# Patient Record
Sex: Female | Born: 1965 | Race: White | Hispanic: No | Marital: Married | Smoking: Former smoker
Health system: Southern US, Community
[De-identification: ages and names within clinical notes are randomized; demographics above are authoritative.]

## PROBLEM LIST (undated history)

## (undated) HISTORY — PX: VAGINAL HYSTERECTOMY: SUR661

---

## 2008-09-29 HISTORY — PX: COLONOSCOPY: SHX174

## 2008-11-22 ENCOUNTER — Ambulatory Visit: Payer: Self-pay | Admitting: Gastroenterology

## 2008-12-18 ENCOUNTER — Encounter: Payer: Self-pay | Admitting: Gastroenterology

## 2008-12-18 ENCOUNTER — Ambulatory Visit (HOSPITAL_COMMUNITY): Admission: RE | Admit: 2008-12-18 | Discharge: 2008-12-18 | Payer: Self-pay | Admitting: Gastroenterology

## 2008-12-18 ENCOUNTER — Ambulatory Visit: Payer: Self-pay | Admitting: Gastroenterology

## 2008-12-22 ENCOUNTER — Encounter: Payer: Self-pay | Admitting: Gastroenterology

## 2008-12-26 ENCOUNTER — Encounter: Payer: Self-pay | Admitting: Gastroenterology

## 2009-01-02 ENCOUNTER — Telehealth: Payer: Self-pay | Admitting: Gastroenterology

## 2009-01-03 ENCOUNTER — Telehealth (INDEPENDENT_AMBULATORY_CARE_PROVIDER_SITE_OTHER): Payer: Self-pay

## 2009-01-10 ENCOUNTER — Encounter: Payer: Self-pay | Admitting: Gastroenterology

## 2009-02-16 ENCOUNTER — Encounter: Payer: Self-pay | Admitting: Gastroenterology

## 2009-06-16 ENCOUNTER — Emergency Department (HOSPITAL_COMMUNITY): Admission: EM | Admit: 2009-06-16 | Discharge: 2009-06-16 | Payer: Self-pay | Admitting: Emergency Medicine

## 2010-08-31 IMAGING — CR DG HAND COMPLETE 3+V*L*
3 series · 3 of 3 positions shown · non-contrast
Comparison: None

CLINICAL DATA: Left hand pain and swelling.  Thumb pain with no
known injury for 2 days.

LEFT HAND - COMPLETE 3+ VIEW

[view not recorded (1 of 3)]
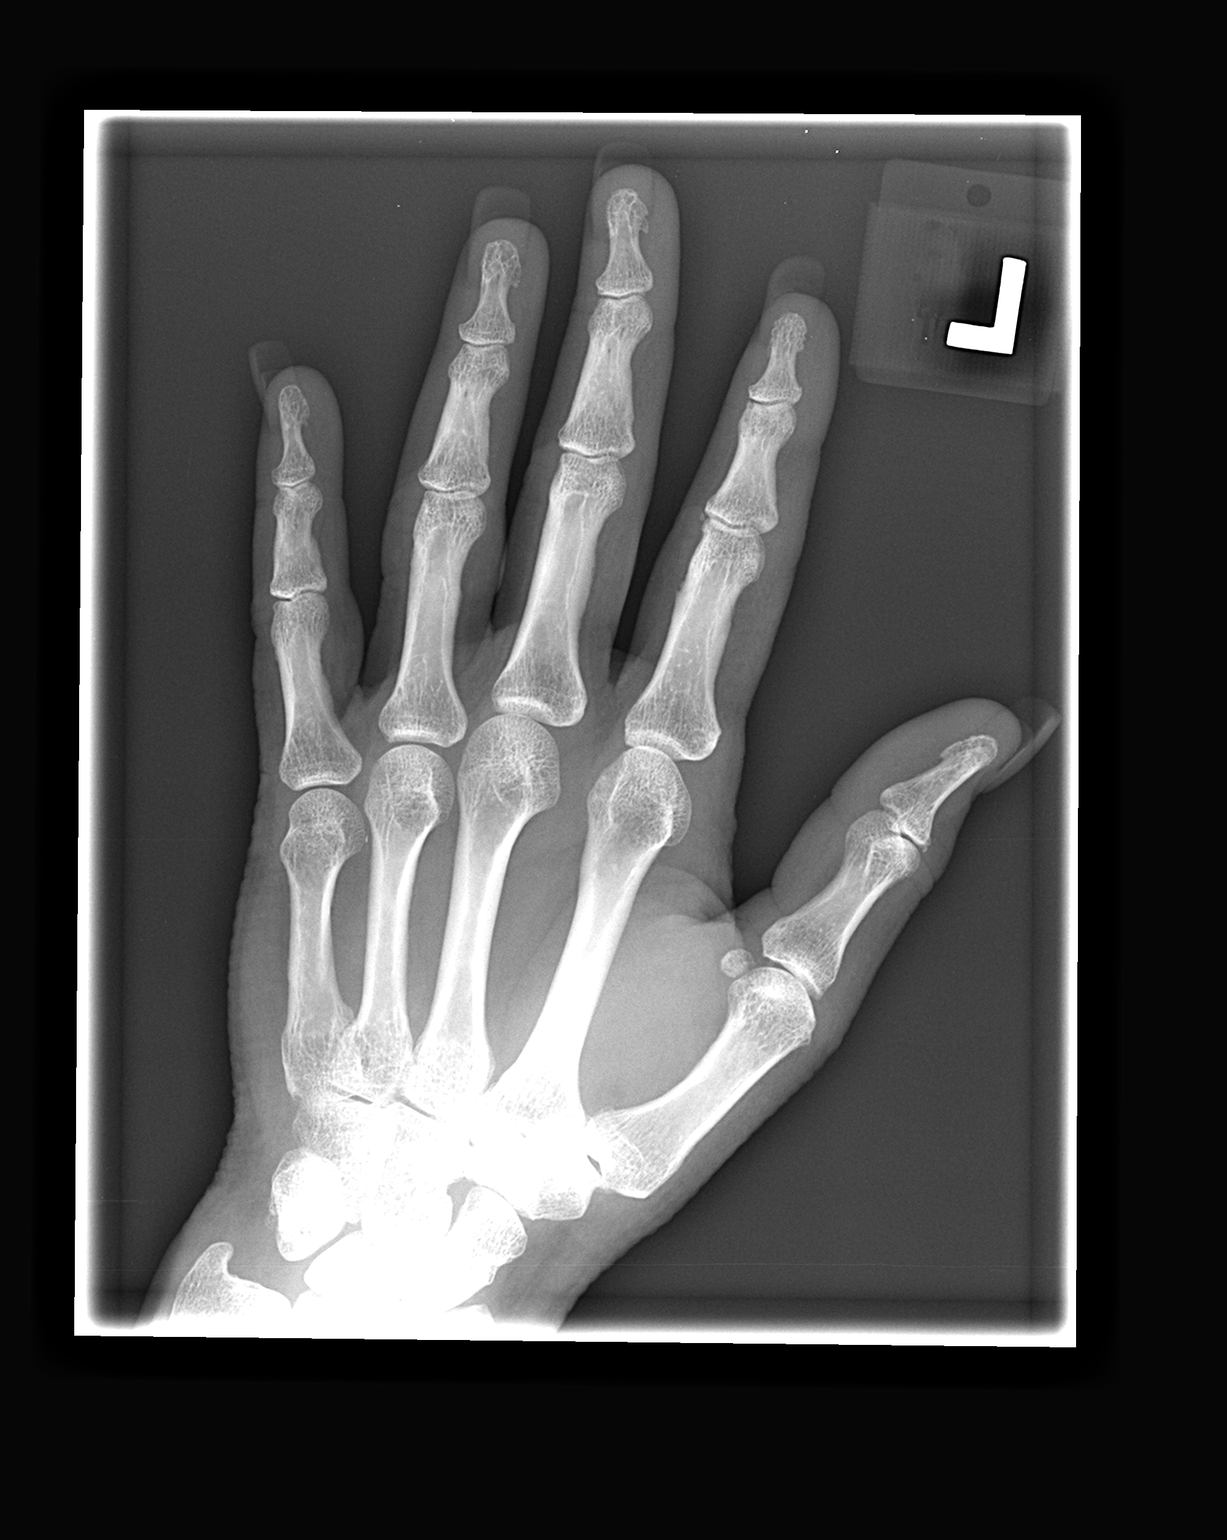

[view not recorded (2 of 3)]
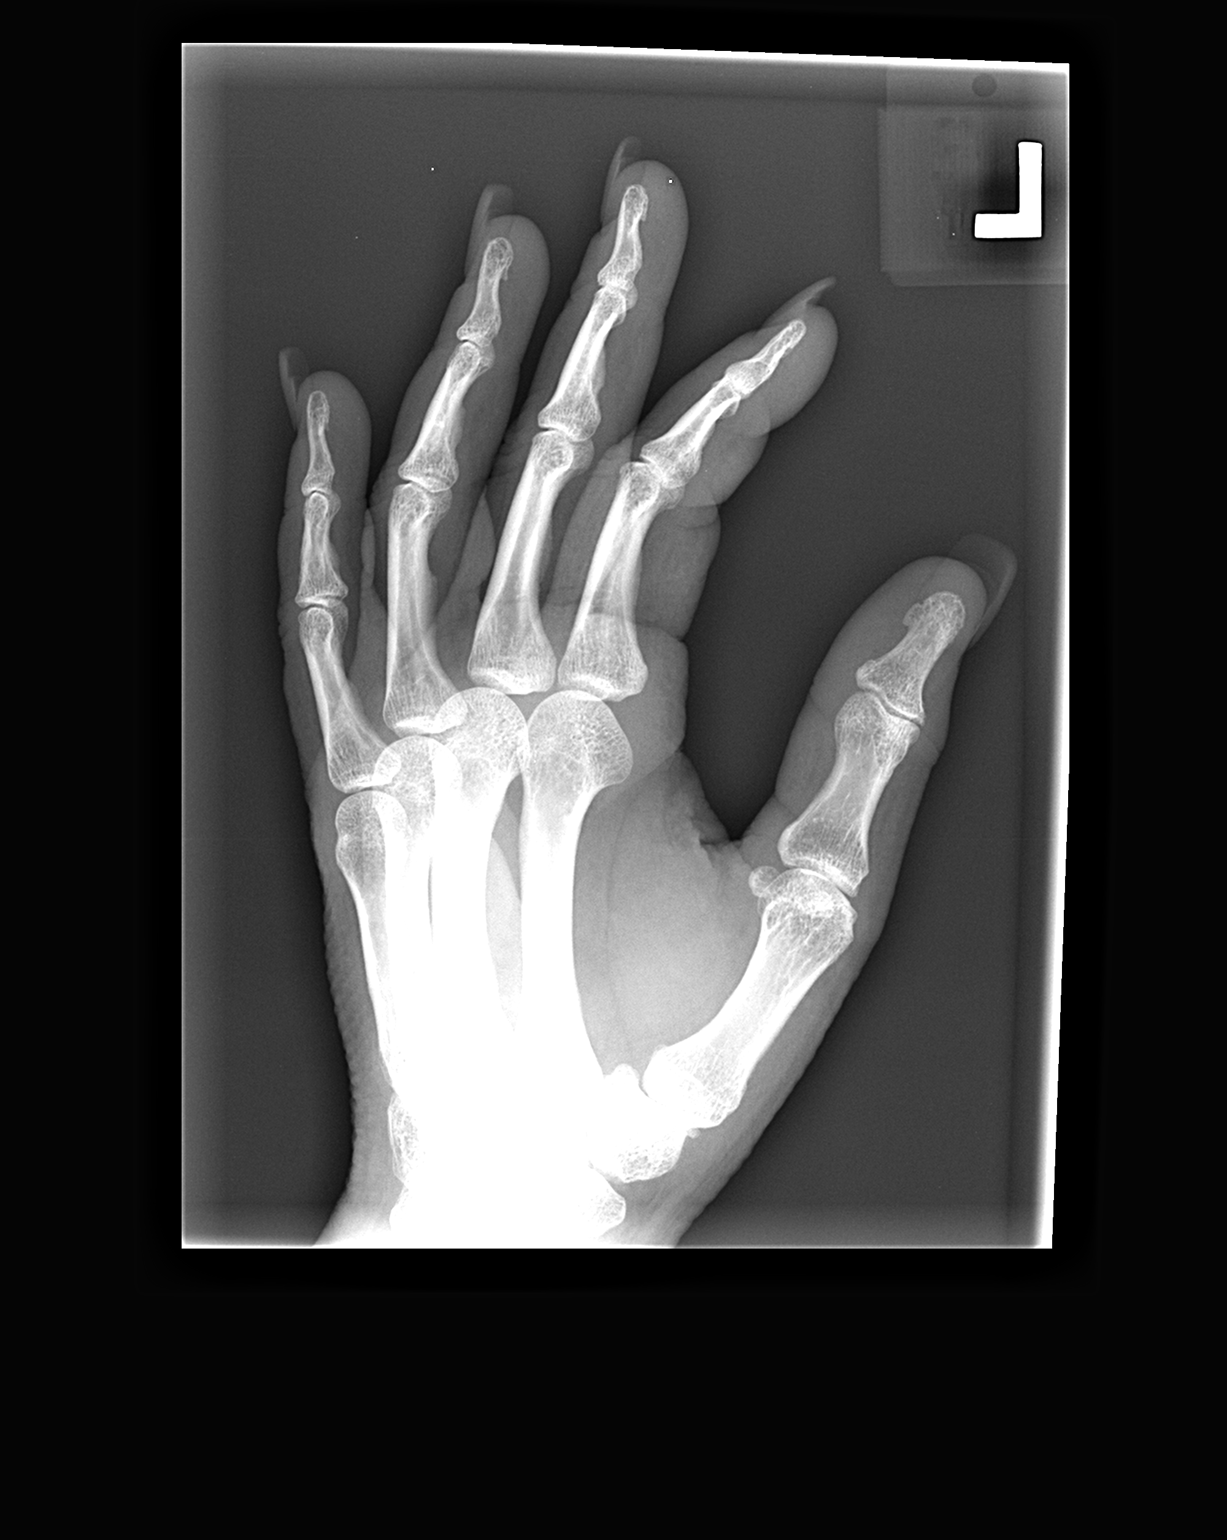

[view not recorded (3 of 3)]
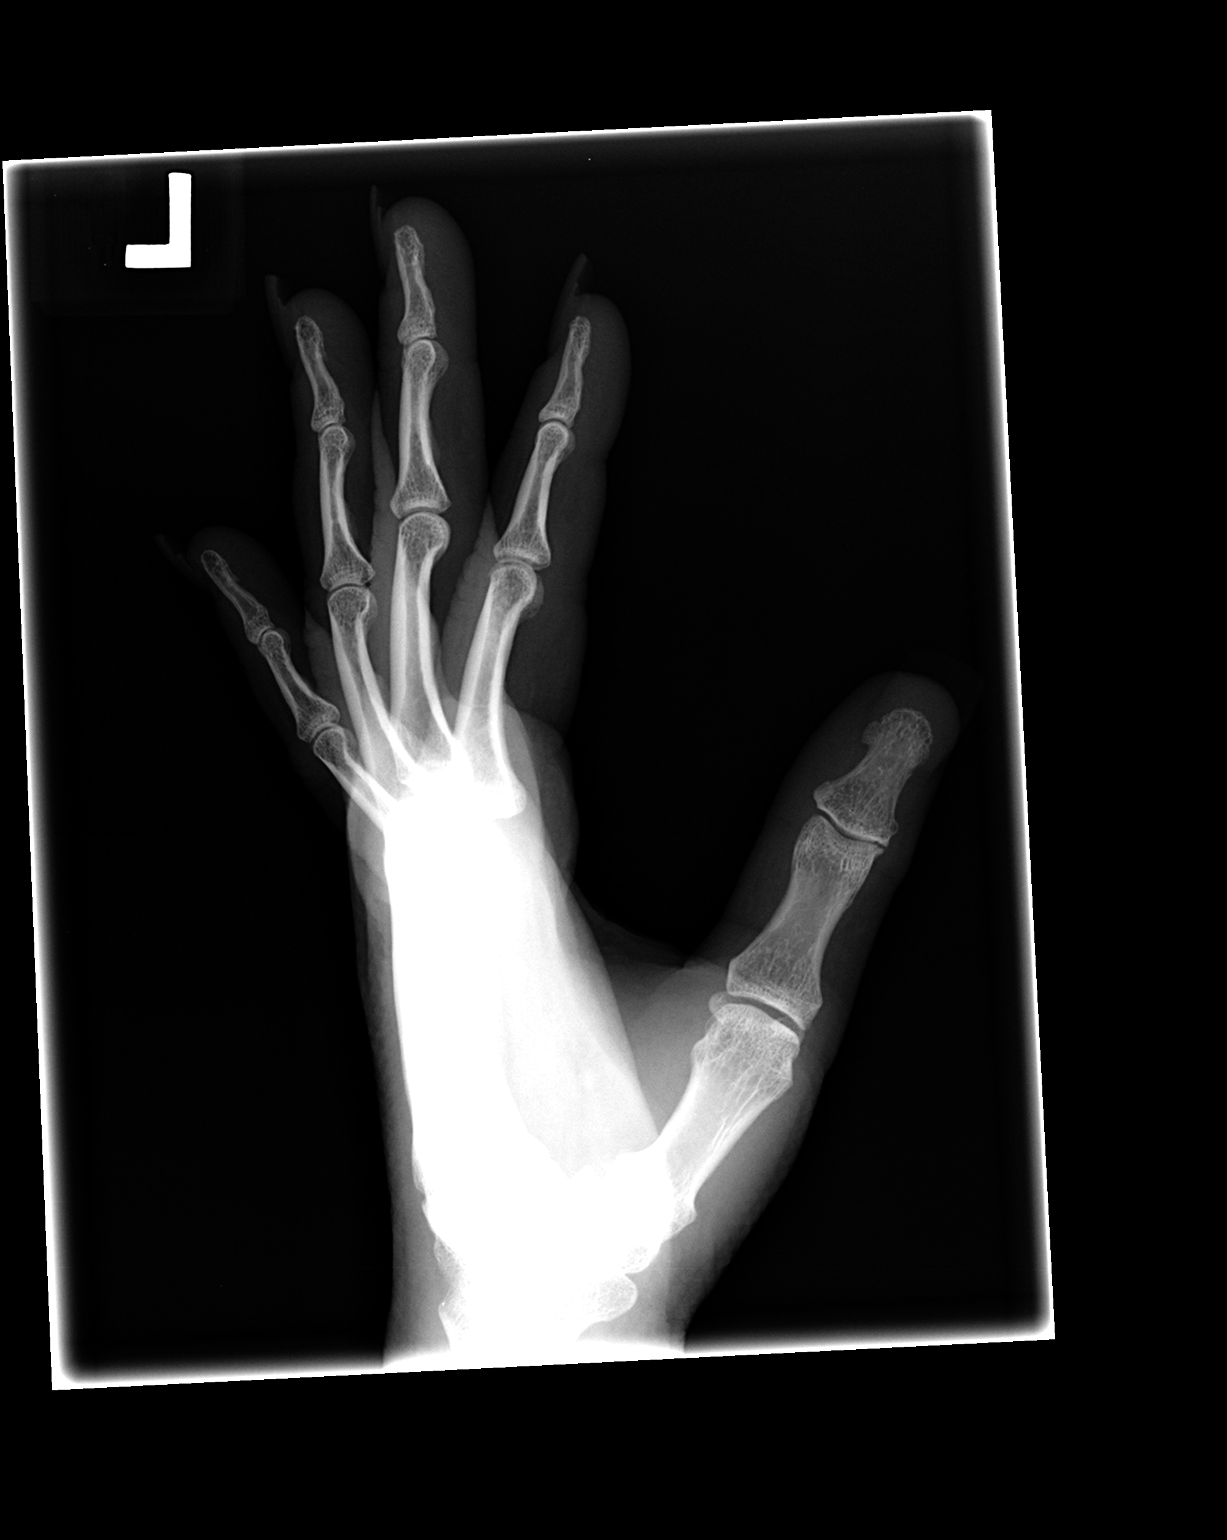

[3 of 3 positions shown; findings below may reference images not displayed]

FINDINGS: Three views are performed, showing no evidence for acute
fracture or dislocation.  No radiopaque foreign body or soft tissue
gas identified.
IMPRESSION: No evidence for acute  abnormality.

## 2011-02-11 NOTE — Op Note (Signed)
NAME:  Makayla Cantrell, Makayla Cantrell                  ACCOUNT NO.:  [id number]   MEDICAL RECORD NO.:  [id number]          PATIENT TYPE:  AMB   LOCATION:  DAY                           FACILITY:  [facility]   PHYSICIAN:  Kassie Mends, M.D.      DATE OF BIRTH:  Feb 20, 1966   DATE OF PROCEDURE:  12/18/2008  DATE OF DISCHARGE:                               OPERATIVE REPORT   REFERRING Yashira Offenberger:  Truddie Coco, Nurse Practitioner, [facility], fax number [phone].   PROCEDURE:  Colonoscopy with snare cautery polypectomy and cold forceps  polypectomy.   INDICATION FOR EXAM:  Ms. Sur is a 45 year old female who has multiple  second-degree relatives with colon cancer.  She presents with  intermittent rectal bleeding.   FINDINGS:  1. A 6-mm sessile cecal polyp removed via snare cautery.  An 8-mm      sessile cecal polyp removed via snare cautery.  A 3-mm sessile      hepatic flexure polyp removed via cold forceps.  A 3-mm sessile      rectal polyp removed via cold forceps.  2. Rare sigmoid colon diverticula.  Otherwise, no masses, inflammatory      changes, or AVM seen.  3. Normal terminal ileum, approximately 10 cm visualized.  4. Small internal hemorrhoids.  Otherwise, normal retroflex view of      the rectum.   DIAGNOSES:  1. Multiple colon polyps.  2. Mild sigmoid colon diverticulosis.  3. Small internal hemorrhoids.   RECOMMENDATIONS:  1. Will call Ms. Henery with the results of her biopsies.  She needs a      screening colonoscopy in 5 years.  2. She should follow a high-fiber diet.  She was given a handout on      high-fiber diet, polyps, diverticulosis, and hemorrhoids.  3. No aspirin, NSAIDs, or anticoagulation for 7 days.   MEDICATIONS:  1. Demerol 75 mg IV.  2. Versed 5 mg IV.   PROCEDURE TECHNIQUE:  Physical exam was performed.  Informed consent was  obtained from the patient after explaining the benefits, risks, and  alternatives to the procedure.  The patient was  connected to the monitor  and placed in left lateral position.  Continuous oxygen was provided by  nasal cannula.  IV medicine administered through an indwelling cannula.  After administration of sedation and rectal exam, the patient's rectum  was intubated and the scope was advanced under direct visualization to  the distal terminal ileum.  The scope was removed slowly by carefully  examining the color, texture, anatomy, and integrity of the mucosa on  the way out.  The patient was recovered in endoscopy, and discharged  home in satisfactory condition.   PATH:  Tubulovillous & hyperplastic polyps. TCS 5 years. First degree  relatives need TCS ar-t age 14 and the q5 years. High fiber diet.      Kassie Mends, M.D.  Electronically Signed     SM/MEDQ  D:  12/18/2008  T:  12/19/2008  Job:  [id number]   cc:   Truddie Coco, FNP

## 2011-02-11 NOTE — Consult Note (Signed)
NAME:  Makayla Cantrell, Makayla Cantrell                  ACCOUNT NO.:  [id number]   MEDICAL RECORD NO.:  [id number]          PATIENT TYPE:  AMB   LOCATION:  DAY                           FACILITY:  [facility]   PHYSICIAN:  Kassie Mends, M.D.      DATE OF BIRTH:  [birth year]   DATE OF CONSULTATION:  DATE OF DISCHARGE:                                 CONSULTATION   PHYSICIAN REQUESTING CONSULTATION:  Truddie Coco, Nurse Practitioner at  [facility] in [place], [place].   HISTORY OF PRESENT ILLNESS:  The patient is a 45 year old lady with a 2-  1/2-year history of intermittent hematochezia.  She describes passing  moderate volume bright red blood per rectum on occasion.  She also  complains of intermittent postprandial stools as well as abdominal pain,  which doubles her over at times, but usually relieved with a bowel  movement.  She denies any nocturnal symptoms.  She does have nausea when  she has abdominal pain.  No other vomiting, heartburn, dysphagia,  odynophagia, or weight loss.  She has had postprandial bowel movements  and abdominal pain resolving after bowel movements for several years.   CURRENT MEDICATIONS:  1. HCTZ daily.  2. Premarin daily.  3. Xanax 0.5 mg p.r.n.  4. Potassium daily.   ALLERGIES:  CODEINE.   PAST MEDICAL HISTORY:  1. Hypertension.  2. Possible IBS.   PAST SURGICAL HISTORY:  She had 2 prior stents placed in the urethra  too.  She has had hysterectomy and has a piece of her left ovary  remaining.  She describes having a barium enema 8 years ago at [facility], which was unremarkable.   FAMILY HISTORY:  Multiple family members with colon cancer, but no first-  degree relatives.  Her maternal grandmother and maternal grandfather  both had colon cancer at advanced age.  She had a paternal aunt who had  colon cancer at age 30.  Mother has no history of colon polyps.  Father  deceased at age 26 with history of stroke and lung disease and CAD  with  bypass surgery.   SOCIAL HISTORY:  She is married, 2 children.  She is employed at [employer].  She quit smoking 3 years ago, has a history of 20-pack-year  tobacco use, occasionally consumes alcohol.   REVIEW OF SYSTEMS:  GI:  See HPI.  CONSTITUTIONAL:  No unintentional  weight loss.  CARDIOPULMONARY:  No chest pain, shortness of breath,  palpitations, or cough.  GENITOURINARY:  No dysuria or hematuria.   PHYSICAL EXAMINATION:  VITAL SIGNS:  Weight 183, height 5 feet 6 inches,  temp 98, blood pressure 118/78, and pulse 64.  GENERAL:  A pleasant, well-nourished, well-developed Caucasian female in  no acute distress.  SKIN:  Warm and dry.  No jaundice.  HEENT:  Sclerae nonicteric.  Oropharyngeal mucosa moist and pink.  No  lesions, erythema, or exudate.  No lymphadenopathy or thyromegaly.  CHEST:  Lungs are clear to auscultation.  CARDIOVASCULAR:  Regular rate and rhythm.  Normal S1 and S2.  No  murmurs,  rubs, or gallops.  ABDOMEN:  Positive bowel sounds.  Abdomen is soft, nontender, and  nondistended.  No organomegaly or masses.  No rebound, no guarding.  No  abdominal bruits or hernias.  LOWER EXTREMITIES:  No edema.   IMPRESSION:  Shawnese is a 45 year old lady with chronic intermittent  postprandial stools and abdominal pain, relieved with defecation likely  due to irritable bowel syndrome.  She has intermittent hematochezia,  which needs further evaluation.  Family history is significant for colon  cancer, but not in any first-degree relatives while there are multiple  second-degree relatives, etc.  I have discussed risks, alternatives, and  benefits with regards to colonoscopy, such as risk of reaction,  medication, bleeding, infection, perforation, and she is agreeable to  proceed.   PLAN:  1. Colonoscopy with Dr. Cira Servant in the near future.  2. Sustenex 1 p.o. daily, #30.  3. HyoMax sublingual  0.125 mg q.a.c., at bedtime, or 4 times daily as      needed for abdominal  pain or loose stool, #90, 3 refills.  4. Further recommendations to follow.   I would like to thank Truddie Coco for allowing [place] to take part in the  care of this patient.      Tana Coast, P.A.      Kassie Mends, M.D.  Electronically Signed    LL/MEDQ  D:  11/22/2008  T:  11/23/2008  Job:  [id number]   cc:   Truddie Coco, NP

## 2016-12-18 ENCOUNTER — Ambulatory Visit (INDEPENDENT_AMBULATORY_CARE_PROVIDER_SITE_OTHER): Payer: Managed Care, Other (non HMO) | Admitting: Nurse Practitioner

## 2016-12-18 ENCOUNTER — Encounter: Payer: Self-pay | Admitting: Nurse Practitioner

## 2016-12-18 DIAGNOSIS — R1013 Epigastric pain: Secondary | ICD-10-CM | POA: Diagnosis not present

## 2016-12-18 MED ORDER — OMEPRAZOLE 20 MG PO CPDR: 20.0000 mg | DELAYED_RELEASE_CAPSULE | Freq: Every day | ORAL | 2 refills | Status: AC

## 2016-12-18 NOTE — Patient Instructions (Signed)
1. I have sent prescription for omeprazole 20 mg to your pharmacy. Take 1 pill, once a day on an empty stomach. 2. Call us in 1-2 weeks and let us know how you're doing. 3. Return for follow-up in 4-6 weeks.

## 2016-12-18 NOTE — Assessment & Plan Note (Addendum)
Noted epigastric pain/chest pain starting 3 months ago which radiates up into her chest and across her upper abdomen with sensation of needing to belch, sharp pain, associated nausea. Cardiac etiology was ruled out by the emergency department including stress echocardiogram. At this point I will start her on omeprazole 20 mg once a day. I will have her call with a progress report in 1-2 weeks. Can consider agent change or other workup. Return for follow-up in 4-6 weeks.

## 2016-12-18 NOTE — Progress Notes (Signed)
Primary Care Physician:  Donnita Falls, MD Primary Gastroenterologist:  Dr. Darrick Penna  Chief Complaint  Patient presents with  . Abdominal Pain  . Bloated    HPI:   Makayla Cantrell is a 51 y.o. female who presents On referral from [facility] for chest pain. This was an ER visit. TSH normal, troponins normal, CBC essentially normal with a hemoglobin of 13.4, BMP normal, stress echo negative for ischemia. Chest x-ray with no acute pulmonary process.  Today she states her symptoms started about 3 months ago. Pain in the epigastric area, substernal, and full upper abdomen. Quite uncomfortable. Sensation of needing to belch. Pain is typically sharp, tends to last about 5-10 minutes but sometimes longer. Has not tried anything OTC to help. Has nausea with her symptoms, but not vomiting. Also with bloating. Denies other abdominal pain, hematochezia, melena. Has subjective fevers occasionally. Denies overt unintentional weight loss. Occasional constipation but not excessive and not recent.Takes NSAIDs about once a week, lately somewhat more. Denies ASA powders. Denies EGD. Has had a colonoscopy 2010. Recommended 10 year repeat exam.  No past medical history on file.  Past Surgical History:  Procedure Laterality Date  . COLONOSCOPY  2010  . VAGINAL HYSTERECTOMY      Current Outpatient Prescriptions  Medication Sig Dispense Refill  . amphetamine-dextroamphetamine (ADDERALL) 15 MG tablet     . BLACK COHOSH PO Take 40 mg by mouth daily.    . Cholecalciferol (VITAMIN D PO) Take by mouth.    . Potassium 99 MG TABS Take 99 mg by mouth daily.     No current facility-administered medications for this visit.     Allergies as of 12/18/2016 - Review Complete 12/18/2016  Allergen Reaction Noted  . Codeine Swelling 12/18/2016    Family History  Problem Relation Age of Onset  . Colon cancer Maternal Grandmother     Social History   Social History  . Marital status: Married   Spouse name: N/A  . Number of children: N/A  . Years of education: N/A   Occupational History  . Not on file.   Social History Main Topics  . Smoking status: Former Smoker    Types: Cigarettes    Quit date: 12/19/2006  . Smokeless tobacco: Never Used  . Alcohol use 1.2 oz/week    2 Glasses of wine per week  . Drug use: No  . Sexual activity: Not on file   Other Topics Concern  . Not on file   Social History Narrative  . No narrative on file    Review of Systems: Complete ROS negative except as per HPI.    Physical Exam: BP 123/74   Pulse 78   Temp 98 F (36.7 C) (Oral)   Ht 5\' 6"  (1.676 m)   Wt 179 lb 3.2 oz (81.3 kg)   BMI 28.92 kg/m  General:   Alert and oriented. Pleasant and cooperative. Well-nourished and well-developed.  Head:  Normocephalic and atraumatic. Eyes:  Without icterus, sclera clear and conjunctiva pink.  Ears:  Normal auditory acuity. Cardiovascular:  S1, S2 present without murmurs appreciated. Extremities without clubbing or edema. Respiratory:  Clear to auscultation bilaterally. No wheezes, rales, or rhonchi. No distress.  Gastrointestinal:  +BS, soft, and non-distended. TTP epigastric area. No HSM noted. No guarding or rebound. No masses appreciated.  Rectal:  Deferred  Musculoskalatal:  Symmetrical without gross deformities. Neurologic:  Alert and oriented x4;  grossly normal neurologically. Psych:  Alert and cooperative. Normal mood  and affect. Heme/Lymph/Immune: No excessive bruising noted.    12/18/2016 11:05 AM   Disclaimer: This note was dictated with voice recognition software. Similar sounding words can inadvertently be transcribed and may not be corrected upon review.

## 2016-12-22 NOTE — Progress Notes (Signed)
CC'D TO PCP °

## 2016-12-23 ENCOUNTER — Telehealth: Payer: Self-pay

## 2016-12-23 NOTE — Telephone Encounter (Signed)
Received fax from pharmacy re: rx for Omeprazole 20mg . "Insurance will not pay, alt requested. Also patient said it was supposed to be 40mg  instead of 20". Received fax yesterday with same info and was placed on EG's desk.  Routing message to EG and placing fax on his desk.

## 2016-12-24 ENCOUNTER — Telehealth: Payer: Self-pay | Admitting: Nurse Practitioner

## 2016-12-24 MED ORDER — PANTOPRAZOLE SODIUM 40 MG PO TBEC: 40.0000 mg | DELAYED_RELEASE_TABLET | Freq: Every day | ORAL | 2 refills | Status: AC

## 2016-12-24 NOTE — Telephone Encounter (Signed)
I spoke to the pharmacist at [facility], Natalia LeatherwoodKatherine, she said the preferred meds for this pt are Pantoprazole, Lansoprazole or Nexium.  Please advise!

## 2016-12-24 NOTE — Addendum Note (Signed)
Addended by: Delane GingerGILL, ERIC A on: 12/24/2016 09:54 AM   Modules accepted: Orders

## 2016-12-24 NOTE — Telephone Encounter (Signed)
Per our discussion, her insurance has declined to pay for omeprazole (?!). Please call the pharmacy or insurance company to find out what PPI they actually will pay for.  Also, pharm fax states patient was under the impression the omeprazole was supposed to be 40 mg. We can send her a copy of her AVS if she would like.

## 2016-12-24 NOTE — Telephone Encounter (Signed)
Left VM that new Rx sent in since first one was not covered and to call if questions.

## 2016-12-24 NOTE — Telephone Encounter (Signed)
Please tell the patient a substitute medication was sent to her Pharmacy as her insurance will not cover the initial one I sent it.

## 2016-12-24 NOTE — Telephone Encounter (Signed)
See other phone note

## 2016-12-30 ENCOUNTER — Telehealth: Payer: Self-pay | Admitting: Gastroenterology

## 2016-12-30 NOTE — Telephone Encounter (Signed)
PT said the Pantoprazole is not working well. She is still having a lot of bloating and intermittent abd pain right below her breasts. She has a lot of nausea.  She went to PCP and they did an [place] and it was normal. She will get a copy to [place].  She is having to work now and cannot come in.  Please advise!

## 2016-12-30 NOTE — Telephone Encounter (Signed)
Patient called and stated the medication she was given here is not working and she is now having bloating.  [phone]

## 2016-12-31 NOTE — Telephone Encounter (Signed)
LMOM for a return call. Dexilant 60 mg #15 at front for pt to pick up.

## 2016-12-31 NOTE — Telephone Encounter (Signed)
We can have her pick up samples of Dexilant to see if that helps.  Also, please ask of any other PPIs she's tried, either Rx or OTC.

## 2017-01-01 NOTE — Telephone Encounter (Signed)
Pt is aware. She has appt with PCP for Monday and she will get them to give her samples of the Dexilant. She lives out of town and hard for her to get here.  She will call needed, only taken Omeprazole previously.

## 2017-01-13 NOTE — Progress Notes (Signed)
REVIEWED-NO ADDITIONAL RECOMMENDATIONS. 

## 2017-02-04 ENCOUNTER — Ambulatory Visit: Payer: Managed Care, Other (non HMO) | Admitting: Nurse Practitioner

## 2017-02-04 ENCOUNTER — Encounter: Payer: Self-pay | Admitting: Nurse Practitioner

## 2017-02-04 ENCOUNTER — Telehealth: Payer: Self-pay | Admitting: Nurse Practitioner

## 2017-02-04 NOTE — Telephone Encounter (Signed)
Noted  

## 2017-02-04 NOTE — Telephone Encounter (Signed)
PATIENT WAS A NO SHOW AND LETTER SENT  °
